# Patient Record
Sex: Female | Born: 1995 | Race: White | Hispanic: No | Marital: Single | Smoking: Never smoker
Health system: Southern US, Community
[De-identification: ages and names within clinical notes are randomized; demographics above are authoritative.]

## PROBLEM LIST (undated history)

## (undated) HISTORY — PX: ADENOIDECTOMY: SUR15

## (undated) HISTORY — PX: TONSILLECTOMY: SUR1361

---

## 2017-04-07 ENCOUNTER — Encounter (HOSPITAL_BASED_OUTPATIENT_CLINIC_OR_DEPARTMENT_OTHER): Payer: Self-pay | Admitting: *Deleted

## 2017-04-07 ENCOUNTER — Emergency Department (HOSPITAL_BASED_OUTPATIENT_CLINIC_OR_DEPARTMENT_OTHER)
Admission: EM | Admit: 2017-04-07 | Discharge: 2017-04-07 | Disposition: A | Discharge status: Home / Self Care | Payer: 59 | Attending: Emergency Medicine | Admitting: Emergency Medicine

## 2017-04-07 DIAGNOSIS — R0981 Nasal congestion: Secondary | ICD-10-CM | POA: Diagnosis present

## 2017-04-07 DIAGNOSIS — Z5321 Procedure and treatment not carried out due to patient leaving prior to being seen by health care provider: Secondary | ICD-10-CM | POA: Diagnosis not present

## 2017-04-07 NOTE — ED Triage Notes (Signed)
Pt states she has been congested since Tuesday. Also c/o yellow sinus drainage and sore throat. No relief with OTC meds.

## 2017-04-08 ENCOUNTER — Emergency Department (HOSPITAL_BASED_OUTPATIENT_CLINIC_OR_DEPARTMENT_OTHER): Payer: 59

## 2017-04-08 ENCOUNTER — Encounter (HOSPITAL_BASED_OUTPATIENT_CLINIC_OR_DEPARTMENT_OTHER): Payer: Self-pay | Admitting: Emergency Medicine

## 2017-04-08 ENCOUNTER — Emergency Department (HOSPITAL_BASED_OUTPATIENT_CLINIC_OR_DEPARTMENT_OTHER)
Admission: EM | Admit: 2017-04-08 | Discharge: 2017-04-08 | Disposition: A | Discharge status: Home / Self Care | Payer: 59 | Attending: Emergency Medicine | Admitting: Emergency Medicine

## 2017-04-08 ENCOUNTER — Other Ambulatory Visit: Payer: Self-pay

## 2017-04-08 DIAGNOSIS — J069 Acute upper respiratory infection, unspecified: Secondary | ICD-10-CM | POA: Diagnosis not present

## 2017-04-08 DIAGNOSIS — Z79899 Other long term (current) drug therapy: Secondary | ICD-10-CM | POA: Insufficient documentation

## 2017-04-08 DIAGNOSIS — R05 Cough: Secondary | ICD-10-CM | POA: Diagnosis present

## 2017-04-08 DIAGNOSIS — B9789 Other viral agents as the cause of diseases classified elsewhere: Secondary | ICD-10-CM | POA: Insufficient documentation

## 2017-04-08 LAB — CBC WITH DIFFERENTIAL/PLATELET
BASOS PCT: 0 %
Basophils Absolute: 0.1 10*3/uL (ref 0.0–0.1)
Eosinophils Absolute: 0.5 10*3/uL (ref 0.0–0.7)
Eosinophils Relative: 4 %
HEMATOCRIT: 34.7 % — AB (ref 36.0–46.0)
HEMOGLOBIN: 11 g/dL — AB (ref 12.0–15.0)
LYMPHS ABS: 2.5 10*3/uL (ref 0.7–4.0)
LYMPHS PCT: 19 %
MCH: 24.7 pg — AB (ref 26.0–34.0)
MCHC: 31.7 g/dL (ref 30.0–36.0)
MCV: 77.8 fL — ABNORMAL LOW (ref 78.0–100.0)
MONO ABS: 1.1 10*3/uL — AB (ref 0.1–1.0)
MONOS PCT: 8 %
NEUTROS ABS: 9.1 10*3/uL — AB (ref 1.7–7.7)
NEUTROS PCT: 69 %
Platelets: 424 10*3/uL — ABNORMAL HIGH (ref 150–400)
RBC: 4.46 MIL/uL (ref 3.87–5.11)
RDW: 13.9 % (ref 11.5–15.5)
WBC: 13.2 10*3/uL — ABNORMAL HIGH (ref 4.0–10.5)

## 2017-04-08 LAB — BASIC METABOLIC PANEL
ANION GAP: 7 (ref 5–15)
BUN: 8 mg/dL (ref 6–20)
CHLORIDE: 103 mmol/L (ref 101–111)
CO2: 24 mmol/L (ref 22–32)
Calcium: 9.3 mg/dL (ref 8.9–10.3)
Creatinine, Ser: 0.94 mg/dL (ref 0.44–1.00)
GFR calc Af Amer: >60 mL/min (ref >60)
GFR calc non Af Amer: >60 mL/min (ref >60)
GLUCOSE: 83 mg/dL (ref 65–99)
POTASSIUM: 3.8 mmol/L (ref 3.5–5.1)
Sodium: 134 mmol/L — ABNORMAL LOW (ref 135–145)

## 2017-04-08 LAB — MONONUCLEOSIS SCREEN: MONO SCREEN: NEGATIVE

## 2017-04-08 MED ORDER — SODIUM CHLORIDE 0.9 % IV SOLN
INTRAVENOUS | Status: DC
Start: 2017-04-08 — End: 2017-04-08

## 2017-04-08 MED ORDER — SODIUM CHLORIDE 0.9 % IV BOLUS (SEPSIS): 1000.0000 mL | Freq: Once | INTRAVENOUS | Status: DC

## 2017-04-08 MED ORDER — DM-GUAIFENESIN ER 30-600 MG PO TB12: 1.0000 | ORAL_TABLET | Freq: Two times a day (BID) | ORAL | 1 refills | Status: AC

## 2017-04-08 NOTE — ED Triage Notes (Signed)
Has been sick with fever and chills and sore throat since Tuesday. Was here last night but left due to wait times. OS drainage on Wed and this am and productive cough

## 2017-04-08 NOTE — Discharge Instructions (Signed)
Monospot was negative for mononucleosis.  Chest x-ray negative for pneumonia.  Continue symptomatic treatment.  And add on Mucinex to the regimen.  Return for any new or worse symptoms.

## 2017-04-08 NOTE — ED Provider Notes (Addendum)
[facility] EMERGENCY DEPARTMENT Provider Note   CSN: [id number] Arrival date & time: 04/08/17  0827     History   Chief Complaint Chief Complaint  Patient presents with  . Cough    HPI Rachel Frost is a 21 y.o. female.  Patient is a Consulting civil engineer at [employer].  Patient states that since Tuesday she has had sore throat fever chills cough congestion feeling like she has fluid in her ears.  The fever has resolved.  Sore throat is improving.  Patient is concerned because she is not better.  She has some friends that have pneumonia.  Chest is sore from the cough.  Patient's been taking Sudafed and Advil.      History reviewed. No pertinent past medical history.  There are no active problems to display for this patient.   Past Surgical History:  Procedure Laterality Date  . ADENOIDECTOMY    . TONSILLECTOMY      OB History    No data available       Home Medications    Prior to Admission medications   Medication Sig Start Date End Date Taking? Authorizing Provider  norethindrone-ethinyl estradiol-iron (ESTROSTEP FE,TILIA FE,TRI-LEGEST FE) 1-20/1-30/1-35 MG-MCG tablet Take 1 tablet by mouth daily.   Yes [provider]  dextromethorphan-guaiFENesin (MUCINEX DM) 30-600 MG 12hr tablet Take 1 tablet 2 (two) times daily by mouth. 04/08/17   Vanetta Mulders, MD    Family History No family history on file.  Social History Social History   Tobacco Use  . Smoking status: Never Smoker  . Smokeless tobacco: Never Used  Substance Use Topics  . Alcohol use: No  . Drug use: No     Allergies   Patient has no known allergies.   Review of Systems Review of Systems  Constitutional: Positive for chills and fever.  HENT: Positive for congestion and sore throat.   Eyes: Negative for redness.  Respiratory: Positive for cough.   Cardiovascular: Positive for chest pain.  Gastrointestinal: Negative for abdominal pain, diarrhea, nausea and  vomiting.  Genitourinary: Negative for dysuria.  Musculoskeletal: Positive for myalgias. Negative for back pain and neck pain.  Skin: Negative for rash.  Hematological: Does not bruise/bleed easily.  Psychiatric/Behavioral: Negative for confusion.     Physical Exam Updated Vital Signs BP 124/86 (BP Location: Right Arm)   Pulse 88   Temp 98.1 F (36.7 C) (Oral)   Resp 16   Ht 1.676 m (5\' 6" )   Wt 65.3 kg (144 lb)   LMP 03/17/2017 (Exact Date)   SpO2 100%   BMI 23.24 kg/m   Physical Exam  Constitutional: She is oriented to person, place, and time. She appears well-developed and well-nourished. No distress.  HENT:  Head: Normocephalic and atraumatic.  Right Ear: External ear normal.  Left Ear: External ear normal.  Mouth/Throat: Oropharynx is clear and moist. No oropharyngeal exudate.  Eyes: EOM are normal. Pupils are equal, round, and reactive to light.  Neck: Normal range of motion. Neck supple.  Cardiovascular: Normal rate, regular rhythm and normal heart sounds.  Pulmonary/Chest: Effort normal and breath sounds normal. No stridor. No respiratory distress. She has no wheezes. She exhibits no tenderness.  Abdominal: Soft. Bowel sounds are normal. There is no tenderness.  Musculoskeletal: Normal range of motion. She exhibits no edema.  Lymphadenopathy:    She has no cervical adenopathy.  Neurological: She is alert and oriented to person, place, and time. No cranial nerve deficit or sensory deficit. She exhibits  normal muscle tone. Coordination normal.  Skin: Skin is warm.  Nursing note and vitals reviewed.    ED Treatments / Results  Labs (all labs ordered are listed, but only abnormal results are displayed) Labs Reviewed  BASIC METABOLIC PANEL - Abnormal; Notable for the following components:      Result Value   Sodium 134 (*)    All other components within normal limits  CBC WITH DIFFERENTIAL/PLATELET - Abnormal; Notable for the following components:   WBC 13.2  (*)    Hemoglobin 11.0 (*)    HCT 34.7 (*)    MCV 77.8 (*)    MCH 24.7 (*)    Platelets 424 (*)    Neutro Abs 9.1 (*)    Monocytes Absolute 1.1 (*)    All other components within normal limits  MONONUCLEOSIS SCREEN    EKG  EKG Interpretation None       Radiology Dg Chest 2 View  Result Date: 04/08/2017 CLINICAL DATA:  21 year old female with persistent cough for several days. EXAM: CHEST  2 VIEW COMPARISON:  None. FINDINGS: The heart size and mediastinal contours are within normal limits. Both lungs are clear. The visualized skeletal structures are unremarkable. IMPRESSION: No active cardiopulmonary disease. Electronically Signed   By: Sande BrothersSerena  Chacko M.D.   On: 04/08/2017 09:38    Procedures Procedures (including critical care time)  Medications Ordered in ED Medications - No data to display   Initial Impression / Assessment and Plan / ED Course  I have reviewed the triage vital signs and the nursing notes.  Pertinent labs & imaging results that were available during my care of the patient were reviewed by me and considered in my medical decision making (see chart for details).     Symptoms consistent with an upper respiratory infection may be flulike illness.  Chest x-ray negative for pneumonia.  Monospot negative.  Labs significant for mild leukocytosis.  Patient nontoxic no acute distress.  Throat is clear tonsils have been removed there is no erythema no swelling no uvula shift.  No concerns for any significant pharyngitis or tonsillitis.  Final Clinical Impressions(s) / ED Diagnoses   Final diagnoses:  Viral URI with cough    New Prescriptions This SmartLink is deprecated. Use AVSMEDLIST instead to display the medication list for a patient.   Vanetta MuldersZackowski, Onyinyechi Huante, MD 04/08/17 1052    Vanetta MuldersZackowski, Rever Pichette, MD 04/08/17 1125

## 2018-06-24 IMAGING — CR DG CHEST 2V
2 series · 2 of 2 positions shown · non-contrast
Comparison: None.

CLINICAL DATA: 21-year-old female with persistent cough for several
days.

EXAM:
CHEST  2 VIEW

[w chest pa]
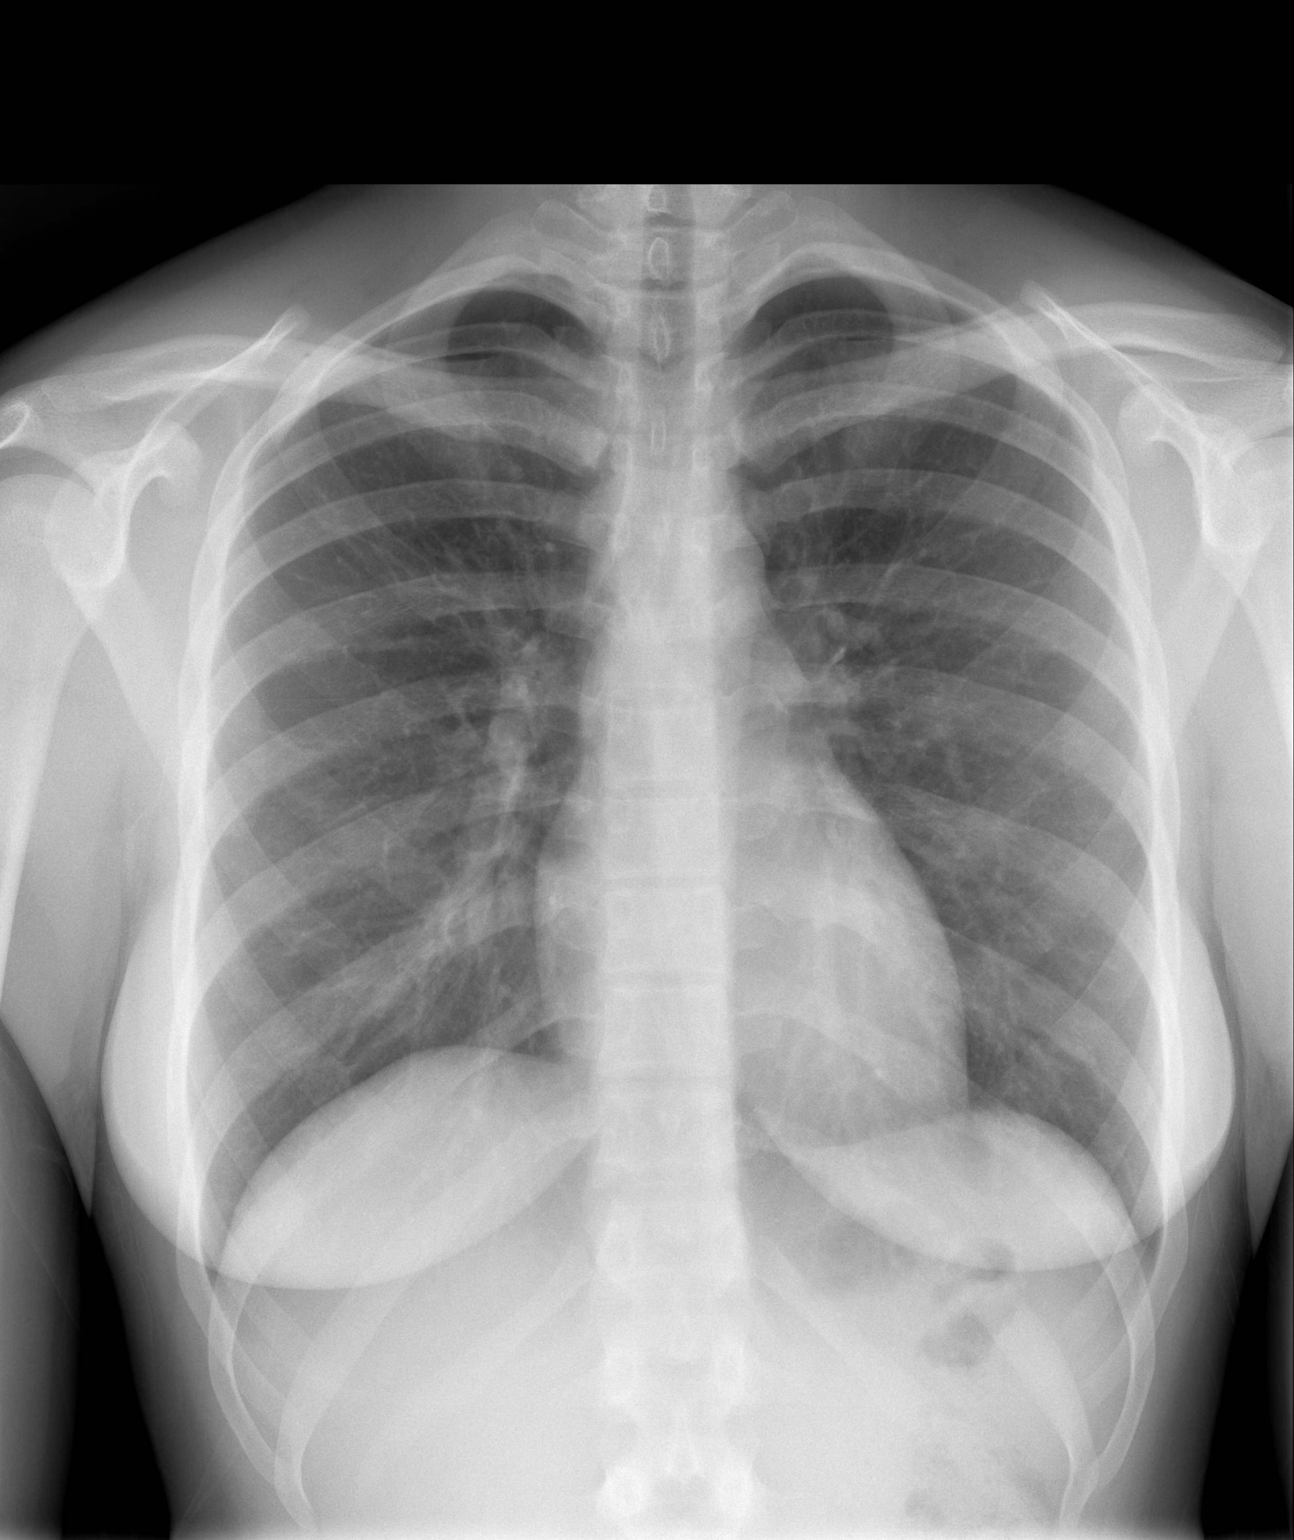

[w chest lat]
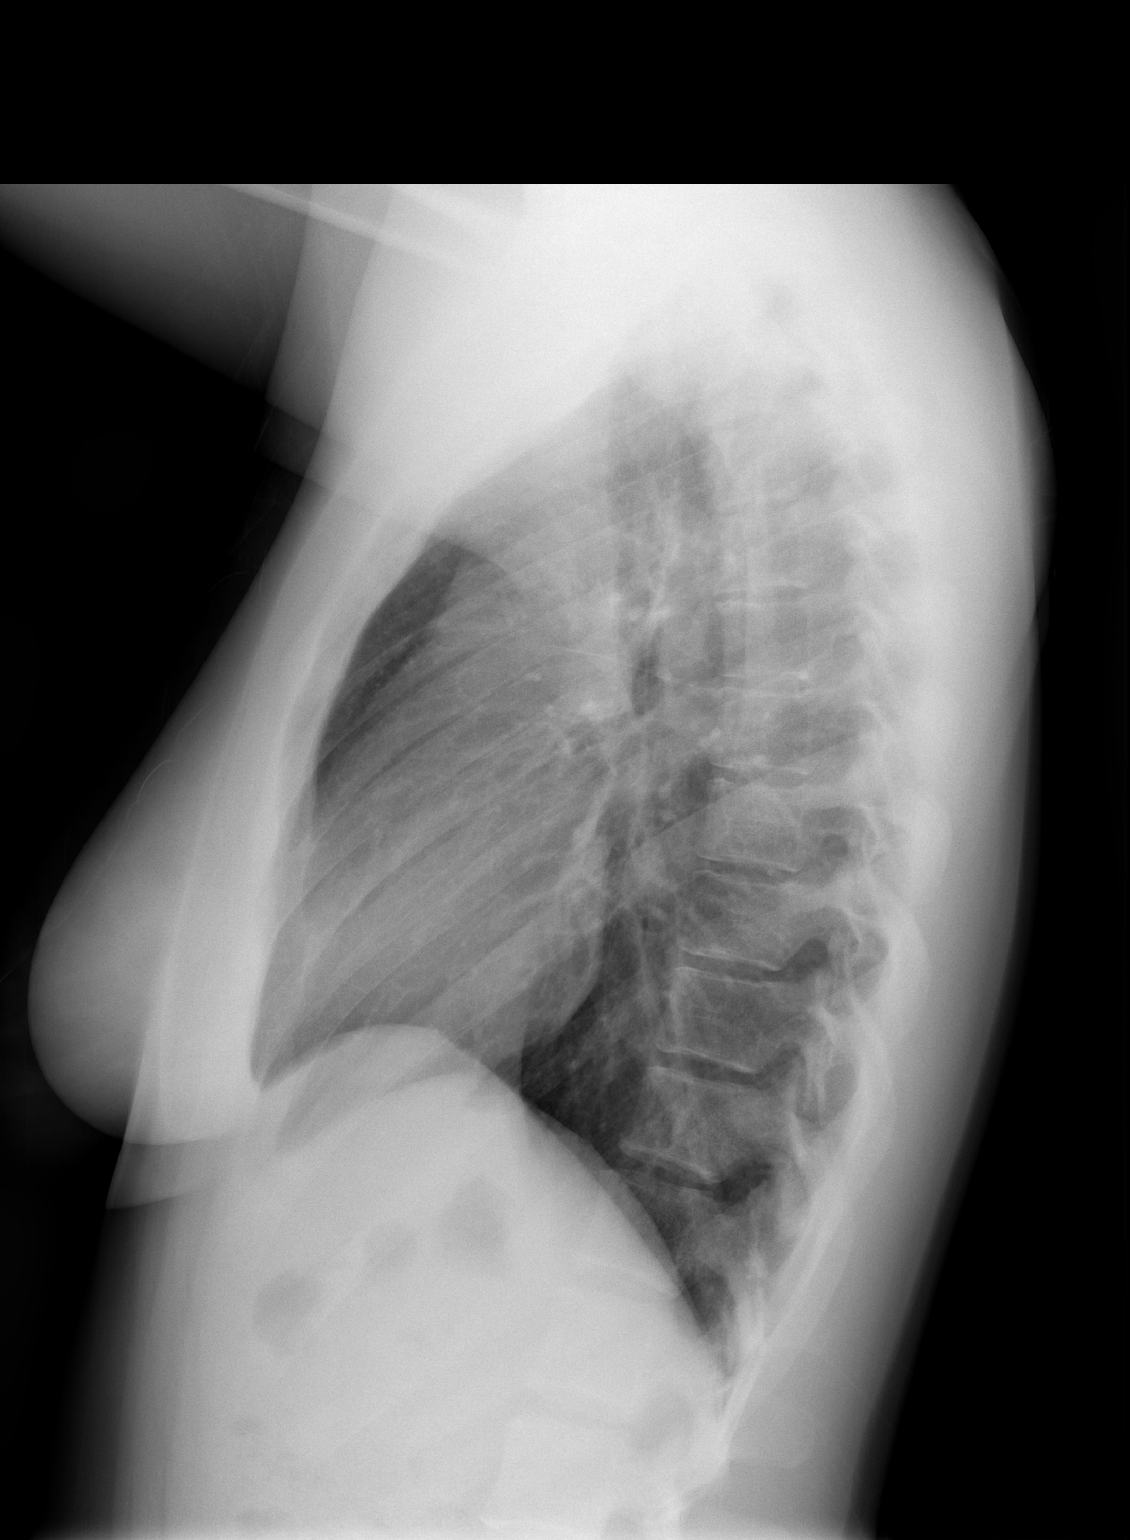

[2 of 2 positions shown; findings below may reference images not displayed]

FINDINGS: The heart size and mediastinal contours are within normal limits.
Both lungs are clear. The visualized skeletal structures are
unremarkable.
IMPRESSION: No active cardiopulmonary disease.
